# Patient Record
Sex: Female | Born: 1950 | Race: White | Hispanic: No | Marital: Married | Smoking: Never smoker
Health system: Southern US, Community
[De-identification: ages and names within clinical notes are randomized; demographics above are authoritative.]

---

## 1974-03-29 HISTORY — PX: BREAST CYST EXCISION: SHX579

## 2014-05-08 ENCOUNTER — Ambulatory Visit: Payer: Self-pay | Admitting: Family Medicine

## 2014-11-12 ENCOUNTER — Other Ambulatory Visit: Payer: Self-pay | Admitting: Family Medicine

## 2014-11-12 DIAGNOSIS — Z1231 Encounter for screening mammogram for malignant neoplasm of breast: Secondary | ICD-10-CM

## 2014-11-19 ENCOUNTER — Other Ambulatory Visit: Payer: Self-pay | Admitting: Family Medicine

## 2014-11-19 ENCOUNTER — Ambulatory Visit
Admission: RE | Admit: 2014-11-19 | Discharge: 2014-11-19 | Disposition: A | Discharge status: Home / Self Care | Payer: Medicare HMO | Source: Ambulatory Visit | Attending: Family Medicine | Admitting: Family Medicine

## 2014-11-19 DIAGNOSIS — Z1231 Encounter for screening mammogram for malignant neoplasm of breast: Secondary | ICD-10-CM | POA: Insufficient documentation

## 2015-10-30 ENCOUNTER — Other Ambulatory Visit: Payer: Self-pay | Admitting: Family Medicine

## 2015-10-30 DIAGNOSIS — Z1231 Encounter for screening mammogram for malignant neoplasm of breast: Secondary | ICD-10-CM

## 2015-11-20 ENCOUNTER — Other Ambulatory Visit: Payer: Self-pay | Admitting: Family Medicine

## 2015-11-20 ENCOUNTER — Ambulatory Visit
Admission: RE | Admit: 2015-11-20 | Discharge: 2015-11-20 | Disposition: A | Discharge status: Home / Self Care | Payer: Medicare HMO | Source: Ambulatory Visit | Attending: Family Medicine | Admitting: Family Medicine

## 2015-11-20 DIAGNOSIS — Z1231 Encounter for screening mammogram for malignant neoplasm of breast: Secondary | ICD-10-CM | POA: Insufficient documentation

## 2016-10-11 ENCOUNTER — Other Ambulatory Visit: Payer: Self-pay | Admitting: Family Medicine

## 2016-10-11 DIAGNOSIS — Z1231 Encounter for screening mammogram for malignant neoplasm of breast: Secondary | ICD-10-CM

## 2016-11-23 ENCOUNTER — Ambulatory Visit
Admission: RE | Admit: 2016-11-23 | Discharge: 2016-11-23 | Disposition: A | Discharge status: Home / Self Care | Payer: Medicare HMO | Source: Ambulatory Visit | Attending: Family Medicine | Admitting: Family Medicine

## 2016-11-23 DIAGNOSIS — Z1231 Encounter for screening mammogram for malignant neoplasm of breast: Secondary | ICD-10-CM | POA: Diagnosis not present

## 2017-12-30 ENCOUNTER — Other Ambulatory Visit: Payer: Self-pay | Admitting: Family Medicine

## 2017-12-30 DIAGNOSIS — Z1231 Encounter for screening mammogram for malignant neoplasm of breast: Secondary | ICD-10-CM

## 2018-01-23 ENCOUNTER — Ambulatory Visit
Admission: RE | Admit: 2018-01-23 | Discharge: 2018-01-23 | Disposition: A | Discharge status: Home / Self Care | Payer: Medicare HMO | Source: Ambulatory Visit | Attending: Family Medicine | Admitting: Family Medicine

## 2018-01-23 DIAGNOSIS — Z1231 Encounter for screening mammogram for malignant neoplasm of breast: Secondary | ICD-10-CM

## 2018-08-02 IMAGING — MG MM DIGITAL SCREENING BILAT W/ TOMO W/ CAD
9 of 13 series · 9 of 29 positions shown · non-contrast
Comparison: Previous exam(s).

CLINICAL DATA: Screening.

EXAM:
2D DIGITAL SCREENING BILATERAL MAMMOGRAM WITH CAD AND ADJUNCT TOMO

[R MLO (1 of 2)]
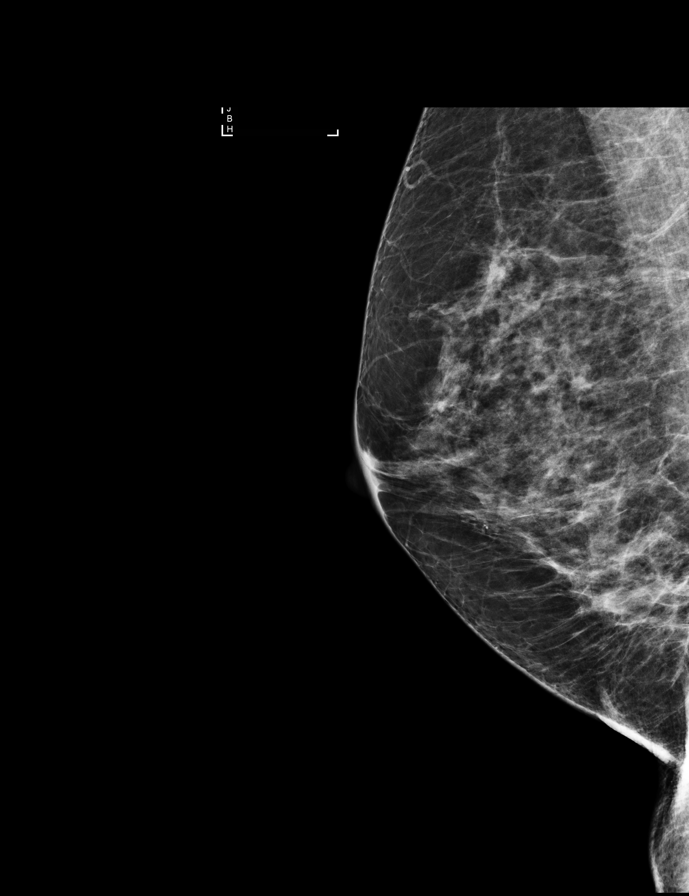

[R CC]
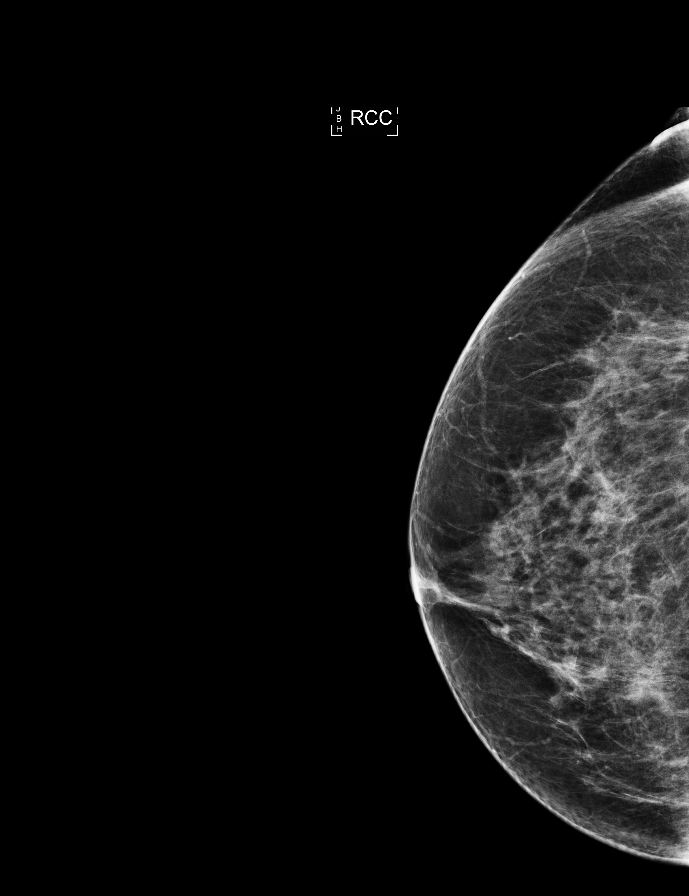

[R CC synth-2D]
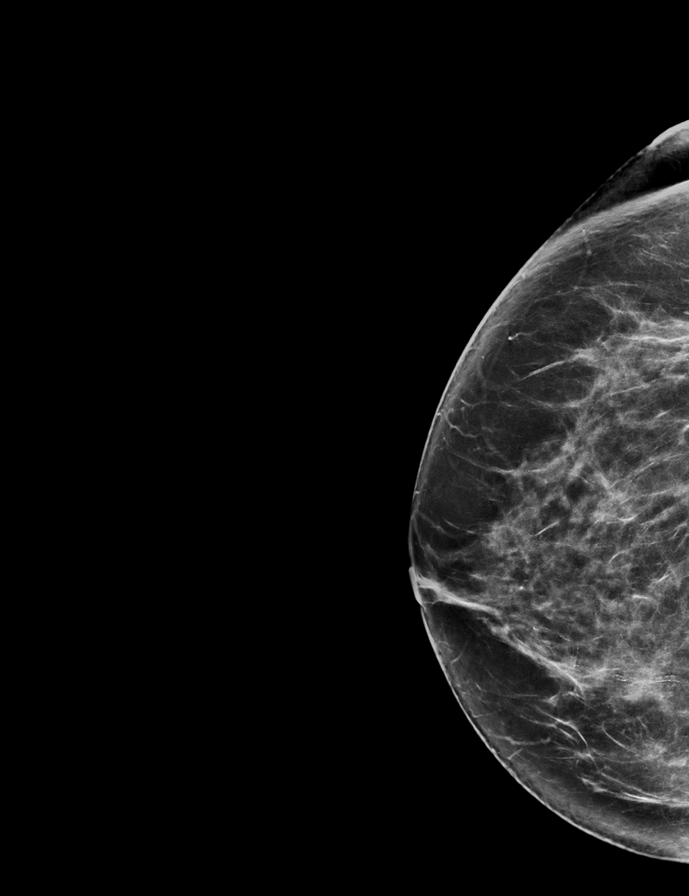

[L MLO synth-2D]
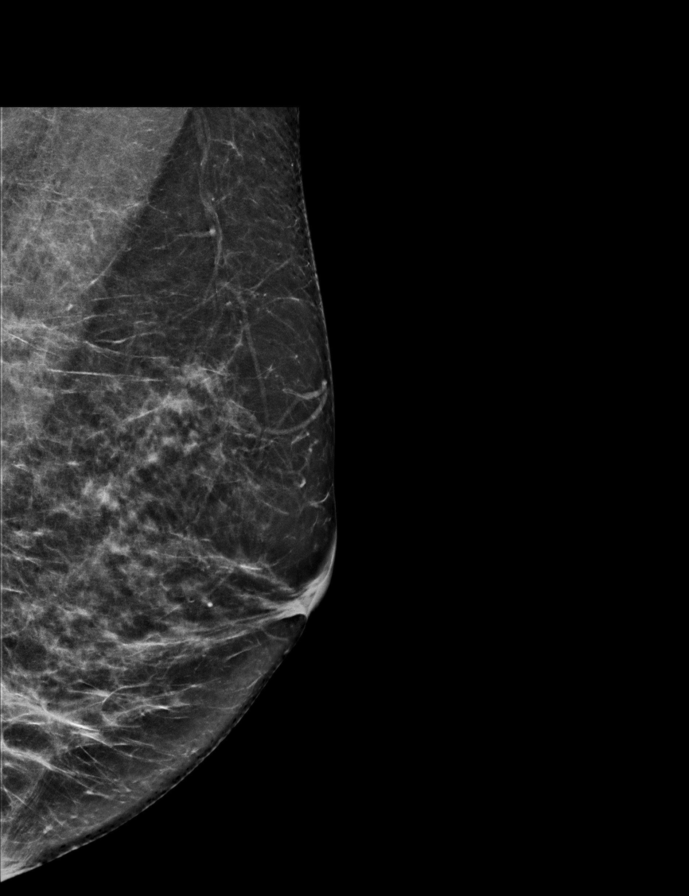

[R MLO synth-2D]
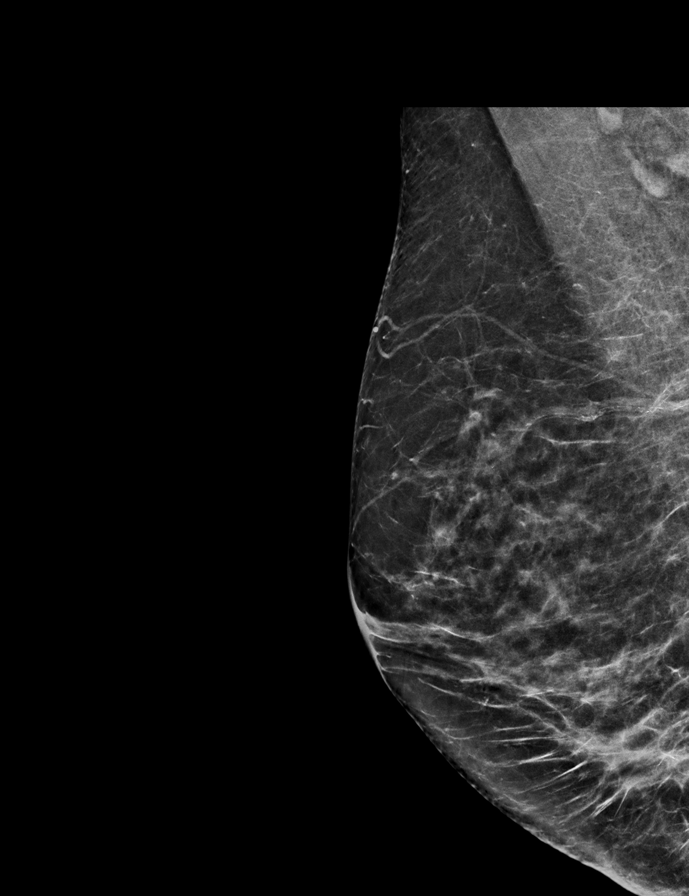

[R MLO (2 of 2)]
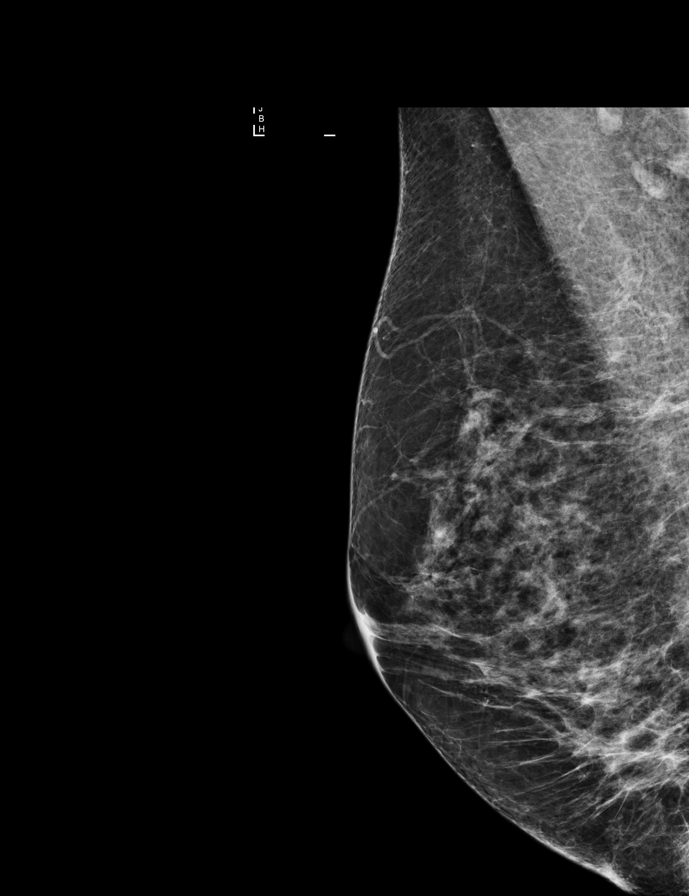

[L CC synth-2D]
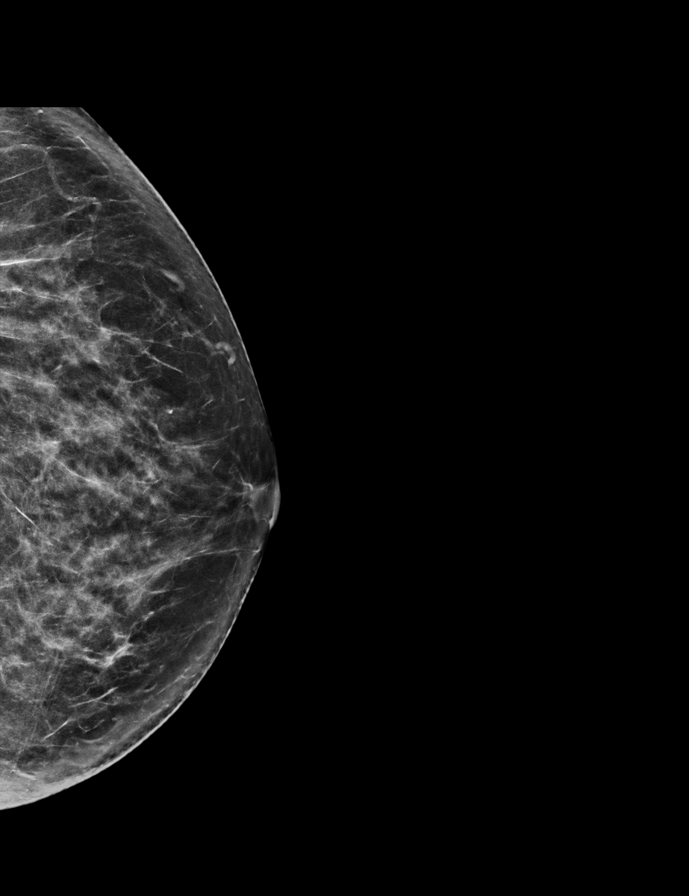

[L CC]
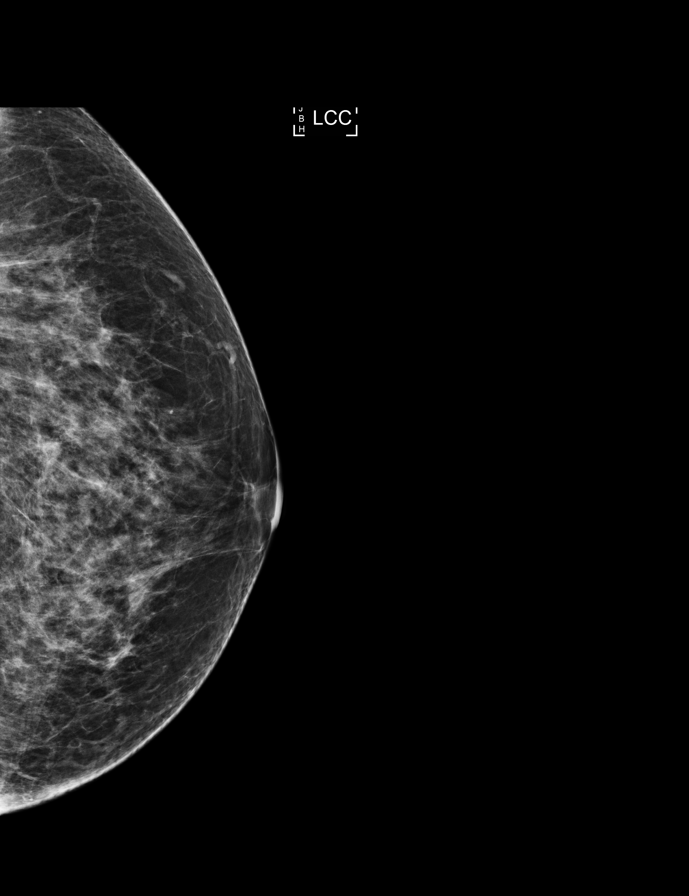

[L MLO]
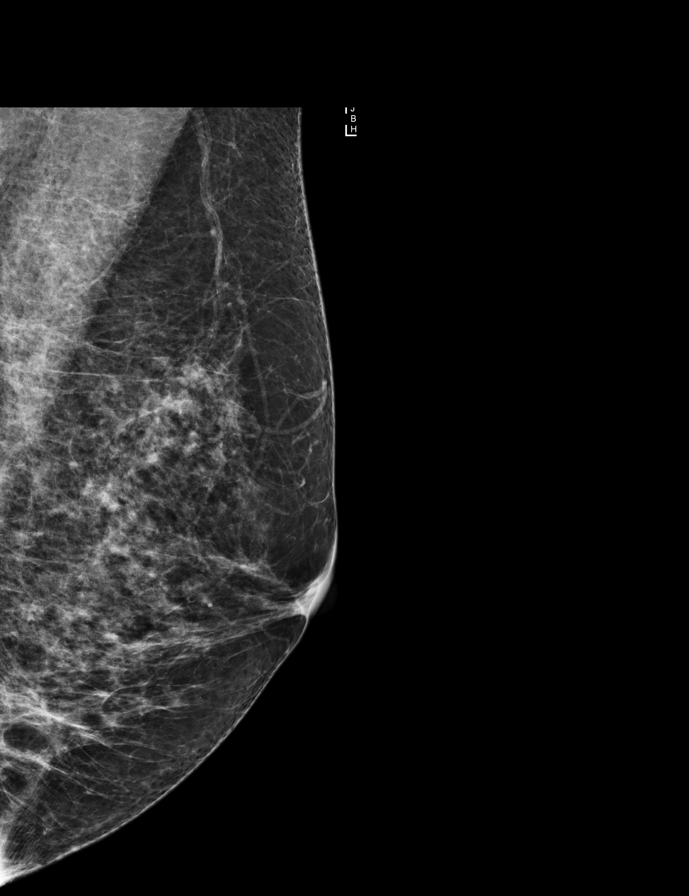

[9 of 29 positions shown; findings below may reference images not displayed]

ACR Breast Density Category c: The breast tissue is heterogeneously
dense, which may obscure small masses.
FINDINGS: There are no findings suspicious for malignancy. Images were
processed with CAD.
IMPRESSION: No mammographic evidence of malignancy. A result letter of this
screening mammogram will be mailed directly to the patient.

RECOMMENDATION:
Screening mammogram in one year. (Code:[id number]-[id number])

BI-RADS CATEGORY  1: Negative.

## 2018-12-18 ENCOUNTER — Other Ambulatory Visit: Payer: Self-pay | Admitting: Family Medicine

## 2018-12-18 DIAGNOSIS — Z1231 Encounter for screening mammogram for malignant neoplasm of breast: Secondary | ICD-10-CM

## 2019-01-25 ENCOUNTER — Ambulatory Visit
Admission: RE | Admit: 2019-01-25 | Discharge: 2019-01-25 | Disposition: A | Discharge status: Home / Self Care | Payer: Medicare HMO | Source: Ambulatory Visit | Attending: Family Medicine | Admitting: Family Medicine

## 2019-01-25 DIAGNOSIS — Z1231 Encounter for screening mammogram for malignant neoplasm of breast: Secondary | ICD-10-CM | POA: Insufficient documentation

## 2019-12-20 ENCOUNTER — Other Ambulatory Visit: Payer: Self-pay | Admitting: Family Medicine

## 2019-12-20 DIAGNOSIS — Z1231 Encounter for screening mammogram for malignant neoplasm of breast: Secondary | ICD-10-CM

## 2020-01-28 ENCOUNTER — Ambulatory Visit
Admission: RE | Admit: 2020-01-28 | Discharge: 2020-01-28 | Disposition: A | Discharge status: Home / Self Care | Payer: Medicare HMO | Source: Ambulatory Visit | Attending: Family Medicine | Admitting: Family Medicine

## 2020-01-28 ENCOUNTER — Other Ambulatory Visit: Payer: Self-pay

## 2020-01-28 DIAGNOSIS — Z1231 Encounter for screening mammogram for malignant neoplasm of breast: Secondary | ICD-10-CM | POA: Diagnosis present

## 2021-01-01 ENCOUNTER — Other Ambulatory Visit: Payer: Self-pay | Admitting: Family Medicine

## 2021-01-01 DIAGNOSIS — Z1231 Encounter for screening mammogram for malignant neoplasm of breast: Secondary | ICD-10-CM

## 2021-01-28 ENCOUNTER — Ambulatory Visit
Admission: RE | Admit: 2021-01-28 | Discharge: 2021-01-28 | Disposition: A | Discharge status: Home / Self Care | Payer: Medicare HMO | Source: Ambulatory Visit | Attending: Family Medicine | Admitting: Family Medicine

## 2021-01-28 ENCOUNTER — Other Ambulatory Visit: Payer: Self-pay

## 2021-01-28 DIAGNOSIS — Z1231 Encounter for screening mammogram for malignant neoplasm of breast: Secondary | ICD-10-CM | POA: Diagnosis not present

## 2021-12-13 ENCOUNTER — Ambulatory Visit
Admission: EM | Admit: 2021-12-13 | Discharge: 2021-12-13 | Disposition: A | Discharge status: Home / Self Care | Payer: Medicare HMO | Attending: Urgent Care | Admitting: Urgent Care

## 2021-12-13 DIAGNOSIS — R110 Nausea: Secondary | ICD-10-CM | POA: Diagnosis not present

## 2021-12-13 DIAGNOSIS — U071 COVID-19: Secondary | ICD-10-CM | POA: Diagnosis not present

## 2021-12-13 DIAGNOSIS — J069 Acute upper respiratory infection, unspecified: Secondary | ICD-10-CM

## 2021-12-13 LAB — RESP PANEL BY RT-PCR (RSV, FLU A&B, COVID)  RVPGX2
Influenza A by PCR: NEGATIVE
Influenza B by PCR: NEGATIVE
Resp Syncytial Virus by PCR: NEGATIVE
SARS Coronavirus 2 by RT PCR: POSITIVE — AB

## 2021-12-13 MED ORDER — ONDANSETRON 4 MG PO TBDP
4.0000 mg | ORAL_TABLET | Freq: Once | ORAL | Status: AC
  Administered 2021-12-13: 4 mg via ORAL

## 2021-12-13 MED ORDER — ONDANSETRON HCL 4 MG PO TABS: 4.0000 mg | ORAL_TABLET | Freq: Four times a day (QID) | ORAL | 0 refills | Status: AC

## 2021-12-13 NOTE — ED Triage Notes (Signed)
Pt. States that since Friday she has been experiencing constant nausea and a headache. Pt. Has been treating herself w/ tylenol.

## 2021-12-13 NOTE — Discharge Instructions (Addendum)
You were swabbed for viral respiratory pathogens today.  You will find the results in your MyChart account.  If positive, you will be contacted to discuss any possible treatments.  Follow-up here or with your primary care provider if symptoms do not resolve within 1 week.      

## 2021-12-13 NOTE — ED Provider Notes (Signed)
Roderic Palau    CSN: [id number] Arrival date & time: 12/13/21  7858      History   Chief Complaint Chief Complaint  Patient presents with   Nausea   Headache    HPI Melissa Hart is a 71 y.o. female.    Headache   Patient is accompanied by her husband who reports similar symptoms.  Presents to urgent care with symptoms x2 days including: Fever Tmax 101.3, chills, myalgia, Swollen glands, Nasal congestion productive of thick mucus, frontal headache.  Feeling very nausea in clinic and since Friday. Dry heaves Friday night.  History reviewed. No pertinent past medical history.  There are no problems to display for this patient.   Past Surgical History:  Procedure Laterality Date   BREAST CYST EXCISION Right 1976   benign    OB History   No obstetric history on file.      Home Medications    Prior to Admission medications   Not on File    Family History Family History  Problem Relation Age of Onset   Breast cancer Neg Hx     Social History Social History   Tobacco Use   Smoking status: Never   Smokeless tobacco: Never     Allergies   Patient has no allergy information on record.   Review of Systems Review of Systems   Physical Exam Triage Vital Signs ED Triage Vitals  Enc Vitals Group     BP 12/13/21 1002 (!) 152/86     Pulse Rate 12/13/21 1002 74     Resp 12/13/21 1002 16     Temp 12/13/21 1002 98.6 F (37 C)     Temp Source 12/13/21 1002 Oral     SpO2 12/13/21 1002 98 %     Weight --      Height --      Head Circumference --      Peak Flow --      Pain Score 12/13/21 1006 6     Pain Loc --      Pain Edu? --      Excl. in [place]? --    No data found.  Updated Vital Signs BP (!) 152/86 (BP Location: Left Arm)   Pulse 74   Temp 98.6 F (37 C) (Oral)   Resp 16   SpO2 98%   Visual Acuity Right Eye Distance:   Left Eye Distance:   Bilateral Distance:    Right Eye Near:   Left Eye Near:    Bilateral  Near:     Physical Exam Vitals reviewed.  Constitutional:      Appearance: She is well-developed. She is ill-appearing.  HENT:     Head: Normocephalic.     Mouth/Throat:     Mouth: Mucous membranes are moist.     Pharynx: Posterior oropharyngeal erythema present. No oropharyngeal exudate.  Cardiovascular:     Rate and Rhythm: Normal rate and regular rhythm.  Pulmonary:     Effort: Pulmonary effort is normal.     Breath sounds: Normal breath sounds.  Abdominal:     General: Bowel sounds are normal.     Palpations: Abdomen is soft.  Musculoskeletal:     Cervical back: Normal range of motion and neck supple.  Lymphadenopathy:     Cervical: No cervical adenopathy.  Skin:    General: Skin is warm and dry.  Neurological:     Mental Status: She is alert and oriented to person, place, and time.  Psychiatric:  Mood and Affect: Mood normal.        Behavior: Behavior normal.      UC Treatments / Results  Labs (all labs ordered are listed, but only abnormal results are displayed) Labs Reviewed - No data to display  EKG   Radiology No results found.  Procedures Procedures (including critical care time)  Medications Ordered in UC Medications - No data to display  Initial Impression / Assessment and Plan / UC Course  I have reviewed the triage vital signs and the nursing notes.  Pertinent labs & imaging results that were available during my care of the patient were reviewed by me and considered in my medical decision making (see chart for details).   Exam is unremarkable.  Lungs CTAB.  Viral URI is suspected.  Respiratory swab obtained for COVID/flu/RSV.  Dose of Zofran 4 mg ODT given in office for acute nausea.  Not completely resolved.  Providing Zofran for home use.  Warned patient about risk of dehydration given her symptoms of nausea and asked her to push hydration as much as possible.  Watch for signs of dehydration including lethargy, darkening urine.  May take  OTC cold/flu medications for symptom relief.  Final Clinical Impressions(s) / UC Diagnoses   Final diagnoses:  None   Discharge Instructions   None    ED Prescriptions   None    PDMP not reviewed this encounter.   Rose Phi, [place] 12/13/21 1048

## 2022-02-26 ENCOUNTER — Other Ambulatory Visit: Payer: Self-pay | Admitting: Family Medicine

## 2022-02-26 DIAGNOSIS — Z1231 Encounter for screening mammogram for malignant neoplasm of breast: Secondary | ICD-10-CM

## 2022-04-19 ENCOUNTER — Ambulatory Visit
Admission: RE | Admit: 2022-04-19 | Discharge: 2022-04-19 | Disposition: A | Discharge status: Home / Self Care | Payer: Medicare HMO | Source: Ambulatory Visit | Attending: Family Medicine | Admitting: Family Medicine

## 2022-04-19 DIAGNOSIS — Z1231 Encounter for screening mammogram for malignant neoplasm of breast: Secondary | ICD-10-CM | POA: Diagnosis not present

## 2022-04-26 ENCOUNTER — Ambulatory Visit (INDEPENDENT_AMBULATORY_CARE_PROVIDER_SITE_OTHER): Payer: Medicare HMO

## 2022-04-26 ENCOUNTER — Ambulatory Visit: Payer: Medicare HMO | Admitting: Podiatry

## 2022-04-26 DIAGNOSIS — G5761 Lesion of plantar nerve, right lower limb: Secondary | ICD-10-CM

## 2022-04-26 DIAGNOSIS — M7741 Metatarsalgia, right foot: Secondary | ICD-10-CM | POA: Diagnosis not present

## 2022-04-26 DIAGNOSIS — M778 Other enthesopathies, not elsewhere classified: Secondary | ICD-10-CM | POA: Diagnosis not present

## 2022-04-26 NOTE — Progress Notes (Signed)
  Subjective:  Patient ID: Melissa Hart, female    DOB: 06/04/1950,  MRN: [id number]  Chief Complaint  Patient presents with   Foot Pain    np possible bone spur right foot ball of foot    72 y.o. female presents with the above complaint. History confirmed with patient.  She had similar pain several years ago and they told her there were bone spurs in the foot that was causing this.  She changed her shoes and Hapads and this helped.  It started again November of last year  Objective:  Physical Exam: warm, good capillary refill, no trophic changes or ulcerative lesions, normal DP and PT pulses, normal sensory exam, and tenderness around the third MTPJ, negative Mulder's click, no radiation of the toes second or third interspace currently, negative anterior drawer test Lachman test of third MPJ, some thinning of metatarsal fat pad   Radiographs: Multiple views x-ray of the right foot: no fracture, dislocation, swelling, there are mild degenerative changes of the interphalangeal joints noted Assessment:   1. Metatarsalgia, right foot   2. Morton's neuroma of right foot      Plan:  Patient was evaluated and treated and all questions answered.  Discussed with her she likely has a small neuroma, as well as metatarsalgia secondary to fat pad thinning. Recommend offloading with metatarsal pads and/or sleeve. Discussed role of CMOs that may benefit as well. Did not isolate or have pinpoint pain on palpation enough to necessitate injection, we discussed if it worsens or does not improve would recommend this as next steps   Return if symptoms worsen or fail to improve.

## 2022-04-26 NOTE — Patient Instructions (Signed)
Look for metatarsal felt pads on [place], or a silicone metatarsal pad sleeve

## 2022-05-03 ENCOUNTER — Ambulatory Visit: Payer: Medicare HMO | Admitting: Podiatry

## 2023-03-25 ENCOUNTER — Other Ambulatory Visit: Payer: Self-pay | Admitting: Family Medicine

## 2023-03-25 DIAGNOSIS — Z1231 Encounter for screening mammogram for malignant neoplasm of breast: Secondary | ICD-10-CM

## 2023-04-22 ENCOUNTER — Ambulatory Visit
Admission: RE | Admit: 2023-04-22 | Discharge: 2023-04-22 | Disposition: A | Discharge status: Home / Self Care | Payer: Medicare HMO | Source: Ambulatory Visit | Attending: Family Medicine | Admitting: Family Medicine

## 2023-04-22 DIAGNOSIS — Z1231 Encounter for screening mammogram for malignant neoplasm of breast: Secondary | ICD-10-CM | POA: Insufficient documentation

## 2023-10-30 LAB — COLOGUARD: COLOGUARD: NEGATIVE

## 2024-03-09 ENCOUNTER — Other Ambulatory Visit: Payer: Self-pay | Admitting: Family Medicine

## 2024-03-09 DIAGNOSIS — Z1231 Encounter for screening mammogram for malignant neoplasm of breast: Secondary | ICD-10-CM

## 2024-04-23 ENCOUNTER — Encounter

## 2024-05-02 ENCOUNTER — Ambulatory Visit
Admission: RE | Admit: 2024-05-02 | Discharge: 2024-05-02 | Disposition: A | Source: Ambulatory Visit | Attending: Family Medicine | Admitting: Family Medicine

## 2024-05-02 DIAGNOSIS — Z1231 Encounter for screening mammogram for malignant neoplasm of breast: Secondary | ICD-10-CM
